# Patient Record
Sex: Female | Born: 1970 | Race: Black or African American | Hispanic: No | Marital: Married | Smoking: Former smoker
Health system: Southern US, Community
[De-identification: ages and names within clinical notes are randomized; demographics above are authoritative.]

## PROBLEM LIST (undated history)

## (undated) DIAGNOSIS — E119 Type 2 diabetes mellitus without complications: Secondary | ICD-10-CM

## (undated) DIAGNOSIS — I10 Essential (primary) hypertension: Secondary | ICD-10-CM

## (undated) HISTORY — PX: CHEST TUBE INSERTION: SHX231

## (undated) HISTORY — PX: FOOT SURGERY: SHX648

---

## 2002-11-17 ENCOUNTER — Emergency Department (HOSPITAL_COMMUNITY): Admission: EM | Admit: 2002-11-17 | Discharge: 2002-11-18 | Payer: Self-pay | Admitting: Emergency Medicine

## 2002-11-18 ENCOUNTER — Encounter: Payer: Self-pay | Admitting: Emergency Medicine

## 2003-04-14 ENCOUNTER — Ambulatory Visit (HOSPITAL_COMMUNITY): Admission: RE | Admit: 2003-04-14 | Discharge: 2003-04-14 | Payer: Self-pay

## 2003-04-17 ENCOUNTER — Emergency Department (HOSPITAL_COMMUNITY): Admission: EM | Admit: 2003-04-17 | Discharge: 2003-04-17 | Payer: Self-pay | Admitting: Emergency Medicine

## 2003-04-24 ENCOUNTER — Ambulatory Visit: Admission: RE | Admit: 2003-04-24 | Discharge: 2003-04-24 | Payer: Self-pay | Admitting: Emergency Medicine

## 2003-11-26 ENCOUNTER — Emergency Department (HOSPITAL_COMMUNITY): Admission: EM | Admit: 2003-11-26 | Discharge: 2003-11-26 | Payer: Self-pay | Admitting: Emergency Medicine

## 2004-02-14 ENCOUNTER — Emergency Department (HOSPITAL_COMMUNITY): Admission: EM | Admit: 2004-02-14 | Discharge: 2004-02-14 | Payer: Self-pay | Admitting: Emergency Medicine

## 2004-04-23 ENCOUNTER — Emergency Department (HOSPITAL_COMMUNITY): Admission: EM | Admit: 2004-04-23 | Discharge: 2004-04-24 | Payer: Self-pay | Admitting: Emergency Medicine

## 2004-04-25 ENCOUNTER — Inpatient Hospital Stay (HOSPITAL_COMMUNITY): Admission: AD | Admit: 2004-04-25 | Discharge: 2004-04-25 | Payer: Self-pay | Admitting: Obstetrics and Gynecology

## 2004-04-27 ENCOUNTER — Inpatient Hospital Stay (HOSPITAL_COMMUNITY): Admission: AD | Admit: 2004-04-27 | Discharge: 2004-04-27 | Payer: Self-pay | Admitting: Obstetrics and Gynecology

## 2004-04-29 ENCOUNTER — Inpatient Hospital Stay (HOSPITAL_COMMUNITY): Admission: AD | Admit: 2004-04-29 | Discharge: 2004-04-29 | Payer: Self-pay | Admitting: *Deleted

## 2004-05-06 ENCOUNTER — Inpatient Hospital Stay (HOSPITAL_COMMUNITY): Admission: AD | Admit: 2004-05-06 | Discharge: 2004-05-06 | Payer: Self-pay | Admitting: *Deleted

## 2004-05-20 ENCOUNTER — Inpatient Hospital Stay (HOSPITAL_COMMUNITY): Admission: AD | Admit: 2004-05-20 | Discharge: 2004-05-20 | Payer: Self-pay | Admitting: *Deleted

## 2004-05-27 ENCOUNTER — Inpatient Hospital Stay (HOSPITAL_COMMUNITY): Admission: AD | Admit: 2004-05-27 | Discharge: 2004-05-27 | Payer: Self-pay | Admitting: Obstetrics and Gynecology

## 2005-05-03 ENCOUNTER — Other Ambulatory Visit: Admission: RE | Admit: 2005-05-03 | Discharge: 2005-05-03 | Payer: Self-pay | Admitting: Obstetrics and Gynecology

## 2011-03-19 ENCOUNTER — Emergency Department (HOSPITAL_BASED_OUTPATIENT_CLINIC_OR_DEPARTMENT_OTHER)
Admission: EM | Admit: 2011-03-19 | Discharge: 2011-03-19 | Disposition: A | Discharge status: Home / Self Care | Payer: Managed Care, Other (non HMO) | Attending: Emergency Medicine | Admitting: Emergency Medicine

## 2011-03-19 ENCOUNTER — Encounter: Payer: Self-pay | Admitting: Emergency Medicine

## 2011-03-19 ENCOUNTER — Other Ambulatory Visit: Payer: Self-pay

## 2011-03-19 DIAGNOSIS — R42 Dizziness and giddiness: Secondary | ICD-10-CM | POA: Insufficient documentation

## 2011-03-19 DIAGNOSIS — R51 Headache: Secondary | ICD-10-CM | POA: Insufficient documentation

## 2011-03-19 DIAGNOSIS — R079 Chest pain, unspecified: Secondary | ICD-10-CM | POA: Insufficient documentation

## 2011-03-19 LAB — CBC
MCH: 28.2 pg (ref 26.0–34.0)
MCHC: 32.4 g/dL (ref 30.0–36.0)
MCV: 87.2 fL (ref 78.0–100.0)
Platelets: 310 10*3/uL (ref 150–400)
RBC: 4.39 MIL/uL (ref 3.87–5.11)

## 2011-03-19 LAB — URINALYSIS, ROUTINE W REFLEX MICROSCOPIC
Glucose, UA: NEGATIVE mg/dL
Hgb urine dipstick: NEGATIVE
Ketones, ur: NEGATIVE mg/dL
Leukocytes, UA: NEGATIVE
Protein, ur: NEGATIVE mg/dL
Urobilinogen, UA: 0.2 mg/dL (ref 0.0–1.0)

## 2011-03-19 LAB — BASIC METABOLIC PANEL
CO2: 25 mEq/L (ref 19–32)
Calcium: 8.9 mg/dL (ref 8.4–10.5)
Creatinine, Ser: 0.73 mg/dL (ref 0.50–1.10)
GFR calc non Af Amer: >90 mL/min (ref >90)

## 2011-03-19 MED ORDER — KETOROLAC TROMETHAMINE 30 MG/ML IJ SOLN
30.0000 mg | Freq: Once | INTRAMUSCULAR | Status: AC
  Administered 2011-03-19: 30 mg via INTRAVENOUS
  Filled 2011-03-19: qty 1

## 2011-03-19 MED ORDER — SODIUM CHLORIDE 0.9 % IV BOLUS (SEPSIS)
1000.0000 mL | Freq: Once | INTRAVENOUS | Status: AC
  Administered 2011-03-19: 1000 mL via INTRAVENOUS

## 2011-03-19 NOTE — ED Notes (Signed)
Pt c/o LT chest pain with radiation to LT jaw and "upper teeth"; also reports some dizziness and lightheadedness.

## 2011-03-19 NOTE — ED Provider Notes (Signed)
History     CSN: [id number] Arrival date & time: 03/19/2011 10:47 AM   First MD Initiated Contact with Patient 03/19/11 1108      Chief Complaint  Patient presents with  . Chest Pain    (Consider location/radiation/quality/duration/timing/severity/associated sxs/prior treatment) HPI Comments: Patient presents with multiple different symptoms.  She states that yesterday she had some onset of pinching in the left side of her chest.  She also had some dizziness symptoms which are not vertiginous but occasionally are a feeling of lightheadedness where she may pass out but she has not done so.  She has been in her normal state of health without any fevers, shortness of breath, coughing or cold symptoms, no nausea, no vomiting, no diarrhea, no dysuria.  Her last menstrual period was November 12 and normal for her.  Patient also states that she has pain in her upper jaw on both sides that she feels that there is pain in her teeth.  She denies any sinus pressure or congestion.  She also endorses some mild headache that she's had over the last week which she took Fioricet for it and it helped initially but now her headache has returned.  It is a dull achy headache.  Patient is a 40 y.o. female presenting with chest pain. The history is provided by the patient. No language interpreter was used.  Chest Pain The chest pain began yesterday. Chest pain occurs intermittently. The chest pain is unchanged. Primary symptoms include dizziness. Pertinent negatives for primary symptoms include no fever, no shortness of breath, no cough, no abdominal pain, no nausea and no vomiting.  Dizziness does not occur with nausea or vomiting.     Past Medical History  Diagnosis Date  . Pneumothorax     Past Surgical History  Procedure Date  . Foot surgery   . Chest tube insertion     No family history on file.  History  Substance Use Topics  . Smoking status: Never Smoker   . Smokeless tobacco: Not on file    . Alcohol Use: Yes     social    OB History    Grav Para Term Preterm Abortions TAB SAB Ect Mult Living                  Review of Systems  Constitutional: Negative.  Negative for fever and chills.  Eyes: Negative.  Negative for discharge and redness.  Respiratory: Negative.  Negative for cough and shortness of breath.   Cardiovascular: Positive for chest pain.  Gastrointestinal: Negative.  Negative for nausea, vomiting, abdominal pain and diarrhea.  Genitourinary: Negative.  Negative for dysuria and vaginal discharge.  Musculoskeletal: Negative.  Negative for back pain.  Skin: Negative.  Negative for color change and rash.  Neurological: Positive for dizziness and light-headedness. Negative for syncope and headaches.  Hematological: Negative.  Negative for adenopathy.  Psychiatric/Behavioral: Negative.  Negative for confusion.  All other systems reviewed and are negative.    Allergies  Review of patient's allergies indicates no known allergies.  Home Medications  No current outpatient prescriptions on file.  BP 115/82  Pulse 82  Resp 18  SpO2 100%  LMP 02/20/2011  Physical Exam  Constitutional: She is oriented to person, place, and time. She appears well-developed and well-nourished.  Non-toxic appearance. She does not have a sickly appearance.  HENT:  Head: Normocephalic and atraumatic.  Eyes: Conjunctivae, EOM and lids are normal. Pupils are equal, round, and reactive to light. No scleral icterus.  Neck: Trachea normal and normal range of motion. Neck supple.  Cardiovascular: Normal rate, regular rhythm and normal heart sounds.   Pulmonary/Chest: Effort normal and breath sounds normal. No respiratory distress. She has no wheezes. She has no rales.  Abdominal: Soft. Normal appearance. There is no tenderness. There is no rebound, no guarding and no CVA tenderness.  Musculoskeletal: Normal range of motion.  Neurological: She is alert and oriented to person, place,  and time. She has normal strength.  Skin: Skin is warm, dry and intact. No rash noted.  Psychiatric: She has a normal mood and affect. Her behavior is normal. Judgment and thought content normal.    ED Course  Procedures (including critical care time)  Results for orders placed during the hospital encounter of 03/19/11  CBC      Component Value Range   WBC 4.5  4.0 - 10.5 (K/uL)   RBC 4.39  3.87 - 5.11 (MIL/uL)   Hemoglobin 12.4  12.0 - 15.0 (g/dL)   HCT 16.1  09.6 - 04.5 (%)   MCV 87.2  78.0 - 100.0 (fL)   MCH 28.2  26.0 - 34.0 (pg)   MCHC 32.4  30.0 - 36.0 (g/dL)   RDW 40.9  81.1 - 91.4 (%)   Platelets 310  150 - 400 (K/uL)  BASIC METABOLIC PANEL      Component Value Range   Sodium 136  135 - 145 (mEq/L)   Potassium 4.0  3.5 - 5.1 (mEq/L)   Chloride 103  96 - 112 (mEq/L)   CO2 25  19 - 32 (mEq/L)   Glucose, Bld 105 (*) 70 - 99 (mg/dL)   BUN 11  6 - 23 (mg/dL)   Creatinine, Ser PENDING  0.50 - 1.10 (mg/dL)   Calcium 8.9  8.4 - 78.2 (mg/dL)   GFR calc non Af Amer PENDING  >90 (mL/min)   GFR calc Af Amer PENDING  >90 (mL/min)  URINALYSIS, ROUTINE W REFLEX MICROSCOPIC      Component Value Range   Color, Urine YELLOW  YELLOW    APPearance CLEAR  CLEAR    Specific Gravity, Urine 1.027  1.005 - 1.030    pH 5.0  5.0 - 8.0    Glucose, UA NEGATIVE  NEGATIVE (mg/dL)   Hgb urine dipstick NEGATIVE  NEGATIVE    Bilirubin Urine NEGATIVE  NEGATIVE    Ketones, ur NEGATIVE  NEGATIVE (mg/dL)   Protein, ur NEGATIVE  NEGATIVE (mg/dL)   Urobilinogen, UA 0.2  0.0 - 1.0 (mg/dL)   Nitrite NEGATIVE  NEGATIVE    Leukocytes, UA NEGATIVE  NEGATIVE   PREGNANCY, URINE      Component Value Range   Preg Test, Ur NEGATIVE    TROPONIN I      Component Value Range   Troponin I <0.30  <0.30 (ng/mL)      Date: 03/19/2011  Rate: 64  Rhythm: normal sinus rhythm  QRS Axis: normal  Intervals: normal  ST/T Wave abnormalities: normal  Conduction Disutrbances:none  Narrative Interpretation:   Old  EKG Reviewed: unchanged from 04/17/2003    MDM  Patient with chest pain that is atypical for ACS.  Patient is also low risk by TIMI SCORE and given that her pain started yesterday and her cardiac markers are normal today with a normal EKG I feel she is safe for discharge home with followup with her primary care physician.  She has no acute symptoms for infection.  Her lightheadedness may be due to some mild dehydration  but does not appear to be neurovascular origin.  Her vital signs are normal here.  Her laboratory studies are normal and she is not pregnant.        Nat Christen, MD 03/19/11 [phone]

## 2012-09-21 ENCOUNTER — Encounter (HOSPITAL_BASED_OUTPATIENT_CLINIC_OR_DEPARTMENT_OTHER): Payer: Self-pay

## 2012-09-21 ENCOUNTER — Emergency Department (HOSPITAL_BASED_OUTPATIENT_CLINIC_OR_DEPARTMENT_OTHER): Payer: Managed Care, Other (non HMO)

## 2012-09-21 ENCOUNTER — Emergency Department (HOSPITAL_BASED_OUTPATIENT_CLINIC_OR_DEPARTMENT_OTHER)
Admission: EM | Admit: 2012-09-21 | Discharge: 2012-09-21 | Disposition: A | Discharge status: Home / Self Care | Payer: Managed Care, Other (non HMO) | Attending: Emergency Medicine | Admitting: Emergency Medicine

## 2012-09-21 DIAGNOSIS — R05 Cough: Secondary | ICD-10-CM | POA: Insufficient documentation

## 2012-09-21 DIAGNOSIS — E119 Type 2 diabetes mellitus without complications: Secondary | ICD-10-CM | POA: Insufficient documentation

## 2012-09-21 DIAGNOSIS — Z8709 Personal history of other diseases of the respiratory system: Secondary | ICD-10-CM | POA: Insufficient documentation

## 2012-09-21 DIAGNOSIS — I10 Essential (primary) hypertension: Secondary | ICD-10-CM | POA: Insufficient documentation

## 2012-09-21 DIAGNOSIS — J209 Acute bronchitis, unspecified: Secondary | ICD-10-CM

## 2012-09-21 DIAGNOSIS — R0789 Other chest pain: Secondary | ICD-10-CM | POA: Insufficient documentation

## 2012-09-21 DIAGNOSIS — Z79899 Other long term (current) drug therapy: Secondary | ICD-10-CM | POA: Insufficient documentation

## 2012-09-21 DIAGNOSIS — Z3202 Encounter for pregnancy test, result negative: Secondary | ICD-10-CM | POA: Insufficient documentation

## 2012-09-21 DIAGNOSIS — R059 Cough, unspecified: Secondary | ICD-10-CM | POA: Insufficient documentation

## 2012-09-21 HISTORY — DX: Type 2 diabetes mellitus without complications: E11.9

## 2012-09-21 HISTORY — DX: Essential (primary) hypertension: I10

## 2012-09-21 LAB — BASIC METABOLIC PANEL
BUN: 11 mg/dL (ref 6–23)
Chloride: 104 mEq/L (ref 96–112)
Creatinine, Ser: 0.9 mg/dL (ref 0.50–1.10)
Glucose, Bld: 106 mg/dL — ABNORMAL HIGH (ref 70–99)
Potassium: 4.5 mEq/L (ref 3.5–5.1)

## 2012-09-21 LAB — CBC WITH DIFFERENTIAL/PLATELET
Eosinophils Absolute: 0.1 10*3/uL (ref 0.0–0.7)
Eosinophils Relative: 2 % (ref 0–5)
Hemoglobin: 12.5 g/dL (ref 12.0–15.0)
Lymphs Abs: 1.9 10*3/uL (ref 0.7–4.0)
MCH: 29.3 pg (ref 26.0–34.0)
MCV: 88.5 fL (ref 78.0–100.0)
Monocytes Absolute: 0.5 10*3/uL (ref 0.1–1.0)
Monocytes Relative: 10 % (ref 3–12)
RBC: 4.27 MIL/uL (ref 3.87–5.11)

## 2012-09-21 LAB — PREGNANCY, URINE: Preg Test, Ur: NEGATIVE

## 2012-09-21 MED ORDER — ASPIRIN 81 MG PO CHEW
CHEWABLE_TABLET | ORAL | Status: AC
  Administered 2012-09-21: 324 mg via ORAL
  Filled 2012-09-21: qty 4

## 2012-09-21 MED ORDER — AZITHROMYCIN 250 MG PO TABS: 250.0000 mg | ORAL_TABLET | Freq: Every day | ORAL | Status: AC

## 2012-09-21 MED ORDER — HYDROCOD POLST-CHLORPHEN POLST 10-8 MG/5ML PO LQCR: 5.0000 mL | Freq: Two times a day (BID) | ORAL | Status: AC | PRN

## 2012-09-21 MED ORDER — ASPIRIN 325 MG PO TABS: 325.0000 mg | ORAL_TABLET | ORAL | Status: DC

## 2012-09-21 MED ORDER — ASPIRIN 81 MG PO CHEW: 324.0000 mg | CHEWABLE_TABLET | Freq: Once | ORAL | Status: AC

## 2012-09-21 NOTE — ED Notes (Signed)
Pt states that she has had cough, sob x3 days, onset of chest pain in mid chest last night.  Pt states that she has been dx with diabetes recently, not on meds for this, dx with htn and on meds.  LS clear but diminished in the RLL.

## 2012-09-21 NOTE — ED Provider Notes (Signed)
History     CSN: [id number]  Arrival date & time 09/21/12  1006   First MD Initiated Contact with Patient 09/21/12 1019      Chief Complaint  Patient presents with  . Shortness of Breath  . Cough    (Consider location/radiation/quality/duration/timing/severity/associated sxs/prior treatment) HPI Comments: Patient presents with complaints of cough, discomfort in the upper part of her chest with coughing, and feeling short of breath for the past three days.  She denies fever or chills.  There is no nausea, diaphoresis, or radiation to the arm or jaw. The cough has been non-productive.  Patient is a 42 y.o. female presenting with shortness of breath. The history is provided by the patient.  Shortness of Breath Severity:  Moderate Onset quality:  Gradual Duration:  3 days Timing:  Constant Progression:  Worsening Chronicity:  New Context: activity   Relieved by:  Nothing Worsened by:  Nothing tried Ineffective treatments:  None tried Associated symptoms: cough     Past Medical History  Diagnosis Date  . Pneumothorax   . Hypertension   . Diabetes mellitus without complication     Past Surgical History  Procedure Laterality Date  . Foot surgery    . Chest tube insertion      History reviewed. No pertinent family history.  History  Substance Use Topics  . Smoking status: Never Smoker   . Smokeless tobacco: Never Used  . Alcohol Use: Yes     Comment: social    OB History   Grav Para Term Preterm Abortions TAB SAB Ect Mult Living                  Review of Systems  Respiratory: Positive for cough and shortness of breath.   All other systems reviewed and are negative.    Allergies  Review of patient's allergies indicates no known allergies.  Home Medications   Current Outpatient Rx  Name  Route  Sig  Dispense  Refill  . amLODipine-valsartan (EXFORGE) 5-160 MG per tablet   Oral   Take 1 tablet by mouth daily.         . Vitamin D, Ergocalciferol,  (DRISDOL) 50000 UNITS CAPS   Oral   Take 50,000 Units by mouth every 7 (seven) days.           BP 113/86  Pulse 94  Temp(Src) 98.9 F (37.2 C) (Oral)  Resp 18  Ht 5\' 7"  (1.702 m)  Wt 274 lb (124.286 kg)  BMI 42.9 kg/m2  SpO2 98%  Physical Exam  Nursing note and vitals reviewed. Constitutional: She is oriented to person, place, and time. She appears well-developed and well-nourished. No distress.  HENT:  Head: Normocephalic and atraumatic.  Neck: Normal range of motion. Neck supple.  Cardiovascular: Normal rate and regular rhythm.  Exam reveals no gallop and no friction rub.   No murmur heard. Pulmonary/Chest: Effort normal and breath sounds normal. No respiratory distress. She has no wheezes.  There is ttp in the upper sternum that seems to reproduce her symptoms.   Abdominal: Soft. Bowel sounds are normal. She exhibits no distension. There is no tenderness.  Musculoskeletal: Normal range of motion.  Neurological: She is alert and oriented to person, place, and time.  Skin: Skin is warm and dry. She is not diaphoretic.    ED Course  Procedures (including critical care time)  Labs Reviewed  BASIC METABOLIC PANEL  TROPONIN I  CBC WITH DIFFERENTIAL   No results found.  No diagnosis found.   Date: 09/21/2012  Rate: 74  Rhythm: normal sinus rhythm  QRS Axis: normal  Intervals: normal  ST/T Wave abnormalities: normal  Conduction Disutrbances:none  Narrative Interpretation:   Old EKG Reviewed: unchanged    MDM  The presentation, exam, and workup are not consistent with a cardiac etiology.  I doubt PE as there is no hypoxia, tachypnea, or tachycardia.  The chest xrays shows only bronchitic changes, but no infiltrate, wbc, or signs or symptoms of respiratory distress.  I feel as though she is stable for discharge, to return prn.  Will treat with zmax, cough medicine.          Geoffery Lyons, MD 09/21/12 1134

## 2014-04-21 ENCOUNTER — Encounter (HOSPITAL_COMMUNITY): Payer: Self-pay | Admitting: Emergency Medicine

## 2014-04-21 ENCOUNTER — Emergency Department (HOSPITAL_COMMUNITY)
Admission: EM | Admit: 2014-04-21 | Discharge: 2014-04-21 | Disposition: A | Discharge status: Home / Self Care | Payer: Managed Care, Other (non HMO) | Attending: Emergency Medicine | Admitting: Emergency Medicine

## 2014-04-21 ENCOUNTER — Emergency Department (HOSPITAL_COMMUNITY): Payer: Managed Care, Other (non HMO)

## 2014-04-21 DIAGNOSIS — Z79899 Other long term (current) drug therapy: Secondary | ICD-10-CM | POA: Diagnosis not present

## 2014-04-21 DIAGNOSIS — R110 Nausea: Secondary | ICD-10-CM | POA: Diagnosis not present

## 2014-04-21 DIAGNOSIS — R61 Generalized hyperhidrosis: Secondary | ICD-10-CM | POA: Insufficient documentation

## 2014-04-21 DIAGNOSIS — R079 Chest pain, unspecified: Secondary | ICD-10-CM

## 2014-04-21 DIAGNOSIS — Z8709 Personal history of other diseases of the respiratory system: Secondary | ICD-10-CM | POA: Insufficient documentation

## 2014-04-21 DIAGNOSIS — I10 Essential (primary) hypertension: Secondary | ICD-10-CM | POA: Diagnosis not present

## 2014-04-21 DIAGNOSIS — E119 Type 2 diabetes mellitus without complications: Secondary | ICD-10-CM | POA: Insufficient documentation

## 2014-04-21 DIAGNOSIS — R0789 Other chest pain: Secondary | ICD-10-CM | POA: Insufficient documentation

## 2014-04-21 LAB — CBC WITH DIFFERENTIAL/PLATELET
BASOS PCT: 0 % (ref 0–1)
Basophils Absolute: 0 10*3/uL (ref 0.0–0.1)
EOS ABS: 0.1 10*3/uL (ref 0.0–0.7)
Eosinophils Relative: 2 % (ref 0–5)
HCT: 39.8 % (ref 36.0–46.0)
Hemoglobin: 12.8 g/dL (ref 12.0–15.0)
Lymphocytes Relative: 42 % (ref 12–46)
Lymphs Abs: 2.1 10*3/uL (ref 0.7–4.0)
MCH: 29 pg (ref 26.0–34.0)
MCHC: 32.2 g/dL (ref 30.0–36.0)
MCV: 90 fL (ref 78.0–100.0)
Monocytes Absolute: 0.4 10*3/uL (ref 0.1–1.0)
Monocytes Relative: 9 % (ref 3–12)
NEUTROS PCT: 47 % (ref 43–77)
Neutro Abs: 2.4 10*3/uL (ref 1.7–7.7)
PLATELETS: 302 10*3/uL (ref 150–400)
RBC: 4.42 MIL/uL (ref 3.87–5.11)
RDW: 13.1 % (ref 11.5–15.5)
WBC: 5.1 10*3/uL (ref 4.0–10.5)

## 2014-04-21 LAB — BASIC METABOLIC PANEL
Anion gap: 5 (ref 5–15)
BUN: 11 mg/dL (ref 6–23)
CHLORIDE: 102 meq/L (ref 96–112)
CO2: 27 mmol/L (ref 19–32)
Calcium: 9.2 mg/dL (ref 8.4–10.5)
Creatinine, Ser: 0.81 mg/dL (ref 0.50–1.10)
GFR, EST NON AFRICAN AMERICAN: 87 mL/min — AB (ref >90)
Glucose, Bld: 118 mg/dL — ABNORMAL HIGH (ref 70–99)
POTASSIUM: 4.1 mmol/L (ref 3.5–5.1)
SODIUM: 134 mmol/L — AB (ref 135–145)

## 2014-04-21 LAB — I-STAT TROPONIN, ED
TROPONIN I, POC: 0 ng/mL (ref 0.00–0.08)
TROPONIN I, POC: 0 ng/mL (ref 0.00–0.08)

## 2014-04-21 MED ORDER — MORPHINE SULFATE 4 MG/ML IJ SOLN
4.0000 mg | Freq: Once | INTRAMUSCULAR | Status: DC
Start: 2014-04-21 — End: 2014-04-22
  Filled 2014-04-21: qty 1

## 2014-04-21 MED ORDER — ONDANSETRON HCL 4 MG/2ML IJ SOLN
4.0000 mg | Freq: Once | INTRAMUSCULAR | Status: AC
  Administered 2014-04-21: 4 mg via INTRAVENOUS
  Filled 2014-04-21: qty 2

## 2014-04-21 MED ORDER — ASPIRIN 81 MG PO CHEW
324.0000 mg | CHEWABLE_TABLET | Freq: Once | ORAL | Status: AC
  Administered 2014-04-21: 324 mg via ORAL
  Filled 2014-04-21: qty 4

## 2014-04-21 NOTE — Discharge Instructions (Signed)

## 2014-04-21 NOTE — ED Notes (Addendum)
Pt began having mid sternal chest heaviness with radiation under L breast @ 1 hour ago, sharp intermittent pain. Pt states she became clammy, shob, dizzy and nauseated.  Pt states she began having numbness to L ring, little finger and thumb just PTA

## 2014-04-21 NOTE — ED Provider Notes (Signed)
CSN: [id number]     Arrival date & time 04/21/14  1947 History   First MD Initiated Contact with Patient 04/21/14 2010     Chief Complaint  Patient presents with  . Chest Pain     (Consider location/radiation/quality/duration/timing/severity/associated sxs/prior Treatment) Patient is a 44 y.o. female presenting with chest pain. The history is provided by the patient.  Chest Pain Pain location:  L chest and substernal area Pain quality: pressure   Pain radiates to:  L arm Pain radiates to the back: no   Pain severity:  No pain Onset quality:  Sudden Duration:  3 hours Timing:  Constant Progression:  Partially resolved Chronicity:  New Context: at rest   Relieved by:  None tried Worsened by:  Nothing tried Ineffective treatments:  None tried Associated symptoms: anxiety, diaphoresis and nausea   Risk factors: birth control, diabetes mellitus, high cholesterol, hypertension and obesity      Patient developed chest pain while driving that started 1 hour prior to arrival. She has never experienced CP before and does not have a cardiologist. She denies SOB, exertional angina. pts symptoms have mostly resolved on arrival.  Past Medical History  Diagnosis Date  . Pneumothorax   . Hypertension   . Diabetes mellitus without complication    Past Surgical History  Procedure Laterality Date  . Foot surgery    . Chest tube insertion     No family history on file. History  Substance Use Topics  . Smoking status: Never Smoker   . Smokeless tobacco: Never Used  . Alcohol Use: Yes     Comment: social   OB History    No data available     Review of Systems  Constitutional: Positive for diaphoresis.  Cardiovascular: Positive for chest pain.  Gastrointestinal: Positive for nausea.    Allergies  Review of patient's allergies indicates no known allergies.  Home Medications   Prior to Admission medications   Medication Sig Start Date End Date Taking? Authorizing Provider   metFORMIN (GLUCOPHAGE) 500 MG tablet Take 500 mg by mouth daily.   Yes Historical Provider, MD  Norethindrone-Ethinyl Estradiol-Fe Biphas (LO LOESTRIN FE) 1 MG-10 MCG / 10 MCG tablet Take 1 tablet by mouth daily.   Yes Historical Provider, MD  omeprazole (PRILOSEC) 40 MG capsule Take 40 mg by mouth daily.   Yes Historical Provider, MD  azithromycin (ZITHROMAX) 250 MG tablet Take 1 tablet (250 mg total) by mouth daily. Take first 2 tablets together, then 1 every day until finished. Patient not taking: Reported on 04/21/2014 09/21/12   Geoffery Lyons, MD  chlorpheniramine-HYDROcodone [facility]) 10-8 MG/5ML LQCR Take 5 mLs by mouth every 12 (twelve) hours as needed. Patient not taking: Reported on 04/21/2014 09/21/12   Geoffery Lyons, MD   BP 124/66 mmHg  Pulse 77  Temp(Src) 98.1 F (36.7 C) (Oral)  Resp 20  Ht  (1.702 m)  Wt 262 lb (118.842 kg)  BMI 41.03 kg/m2  SpO2 98% Physical Exam  Constitutional: She appears well-developed and well-nourished. No distress.  HENT:  Head: Normocephalic and atraumatic.  Eyes: Pupils are equal, round, and reactive to light.  Neck: Normal range of motion. Neck supple.  Cardiovascular: Normal rate and regular rhythm.   Pulmonary/Chest: Effort normal. No respiratory distress. She has no wheezes. She exhibits no tenderness and no bony tenderness.  Abdominal: Soft.  Neurological: She is alert.  Skin: Skin is warm and dry.  Nursing note and vitals reviewed.   ED Course  Procedures (including critical care time) Labs Review Labs Reviewed  BASIC METABOLIC PANEL - Abnormal; Notable for the following:    Sodium 134 (*)    Glucose, Bld 118 (*)    GFR calc non Af Amer 87 (*)    All other components within normal limits  CBC WITH DIFFERENTIAL  I-STAT TROPOININ, ED  Rosezena SensorI-STAT TROPOININ, ED    Imaging Review Dg Chest 2 View  04/21/2014   CLINICAL DATA:  Midsternal chest pain radiating to the left. Symptoms for 2 hr.  EXAM: CHEST  2 VIEW   COMPARISON:  09/21/2012  FINDINGS: The cardiomediastinal contours are normal. The lungs are clear. Pulmonary vasculature is normal. No consolidation, pleural effusion, or pneumothorax. No acute osseous abnormalities are seen.  IMPRESSION: No acute pulmonary process.   Electronically Signed   By: Rubye OaksMelanie  Ehinger M.D.   On: 04/21/2014 22:09     EKG Interpretation   Date/Time:  Tuesday April 21 2014 19:55:41 EST Ventricular Rate:  93 PR Interval:  145 QRS Duration: 77 QT Interval:  350 QTC Calculation: 435 R Axis:   46 Text Interpretation:  Sinus rhythm Left atrial enlargement No significant  change since last tracing Confirmed by HARRISON  MD, FORREST ([id number]) on  04/21/2014 8:44:57 PM      MDM   Final diagnoses:  Chest pain    Patients story is concerning for cardiac pain. She has had two negative Troponins and normal chest xray. EKG is reassuring. She is no longer having pain the ED. The patient is feeling better and is requesting discharge. I discussed the case with Dr. Romeo AppleHarrison, her HEART score is 3, patient can go home if she follow-ups with cardiology and agrees to return to the ED if symptoms return. Pt was offered admission for further evaluation but she prefers home.  44 y.o.Caliana C Kue's evaluation in the Emergency Department is complete. It has been determined that no acute conditions requiring further emergency intervention are present at this time. The patient/guardian have been advised of the diagnosis and plan. We have discussed signs and symptoms that warrant return to the ED, such as changes or worsening in symptoms.  Vital signs are stable at discharge. Filed Vitals:   04/21/14 2308  BP: 124/66  Pulse: 77  Temp:   Resp: 20    Patient/guardian has voiced understanding and agreed to follow-up with the PCP or specialist.     Dorthula Matasiffany G Mykaila Blunck, PA-C 04/21/14 [phone]  Purvis SheffieldForrest Harrison, MD 04/23/14 1025

## 2016-02-12 IMAGING — CR DG CHEST 2V
2 series · 2 of 2 positions shown · non-contrast
Comparison: 09/21/2012

CLINICAL DATA: Midsternal chest pain radiating to the left.
Symptoms for 2 hr.

EXAM:
CHEST  2 VIEW

[w chest pa]
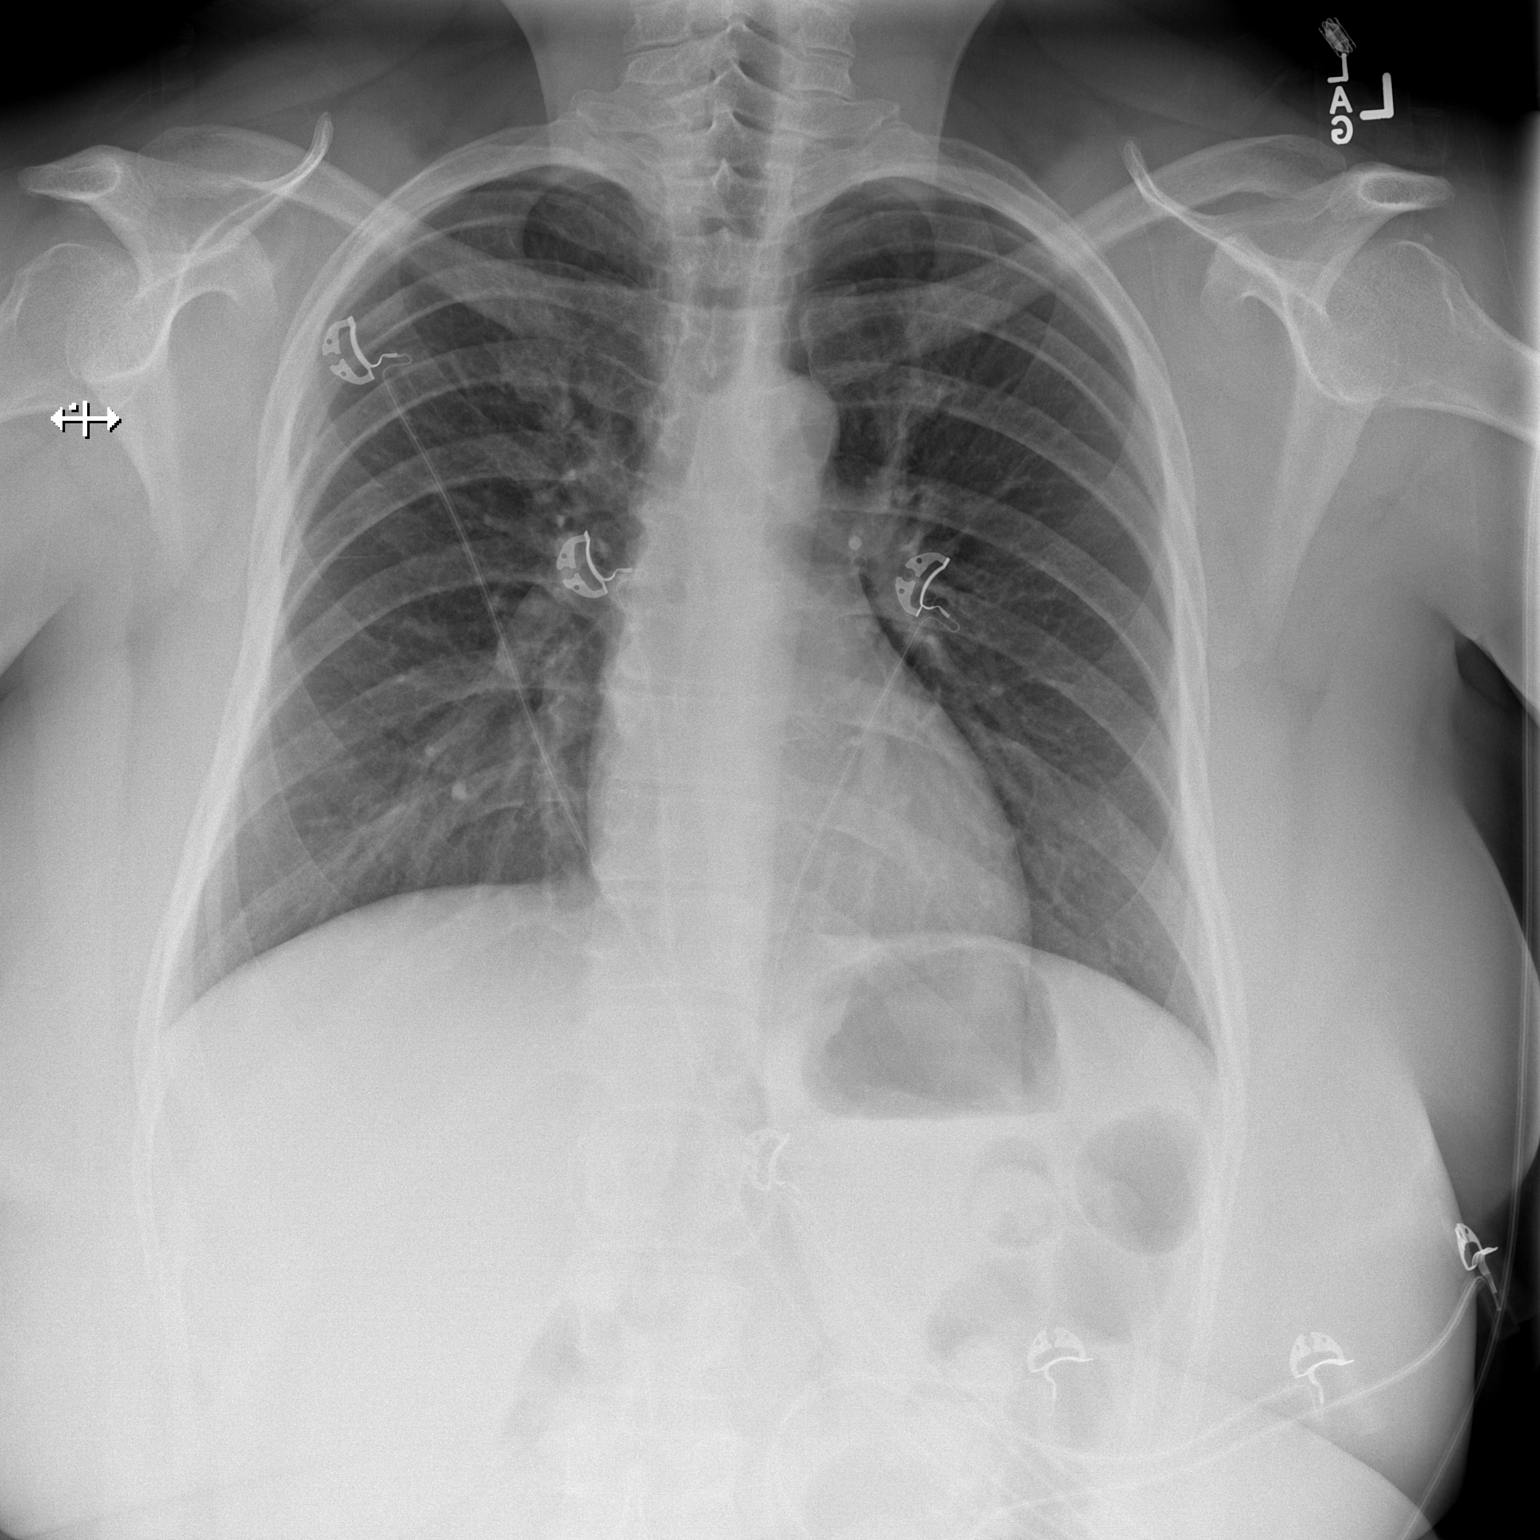

[w chest lat]
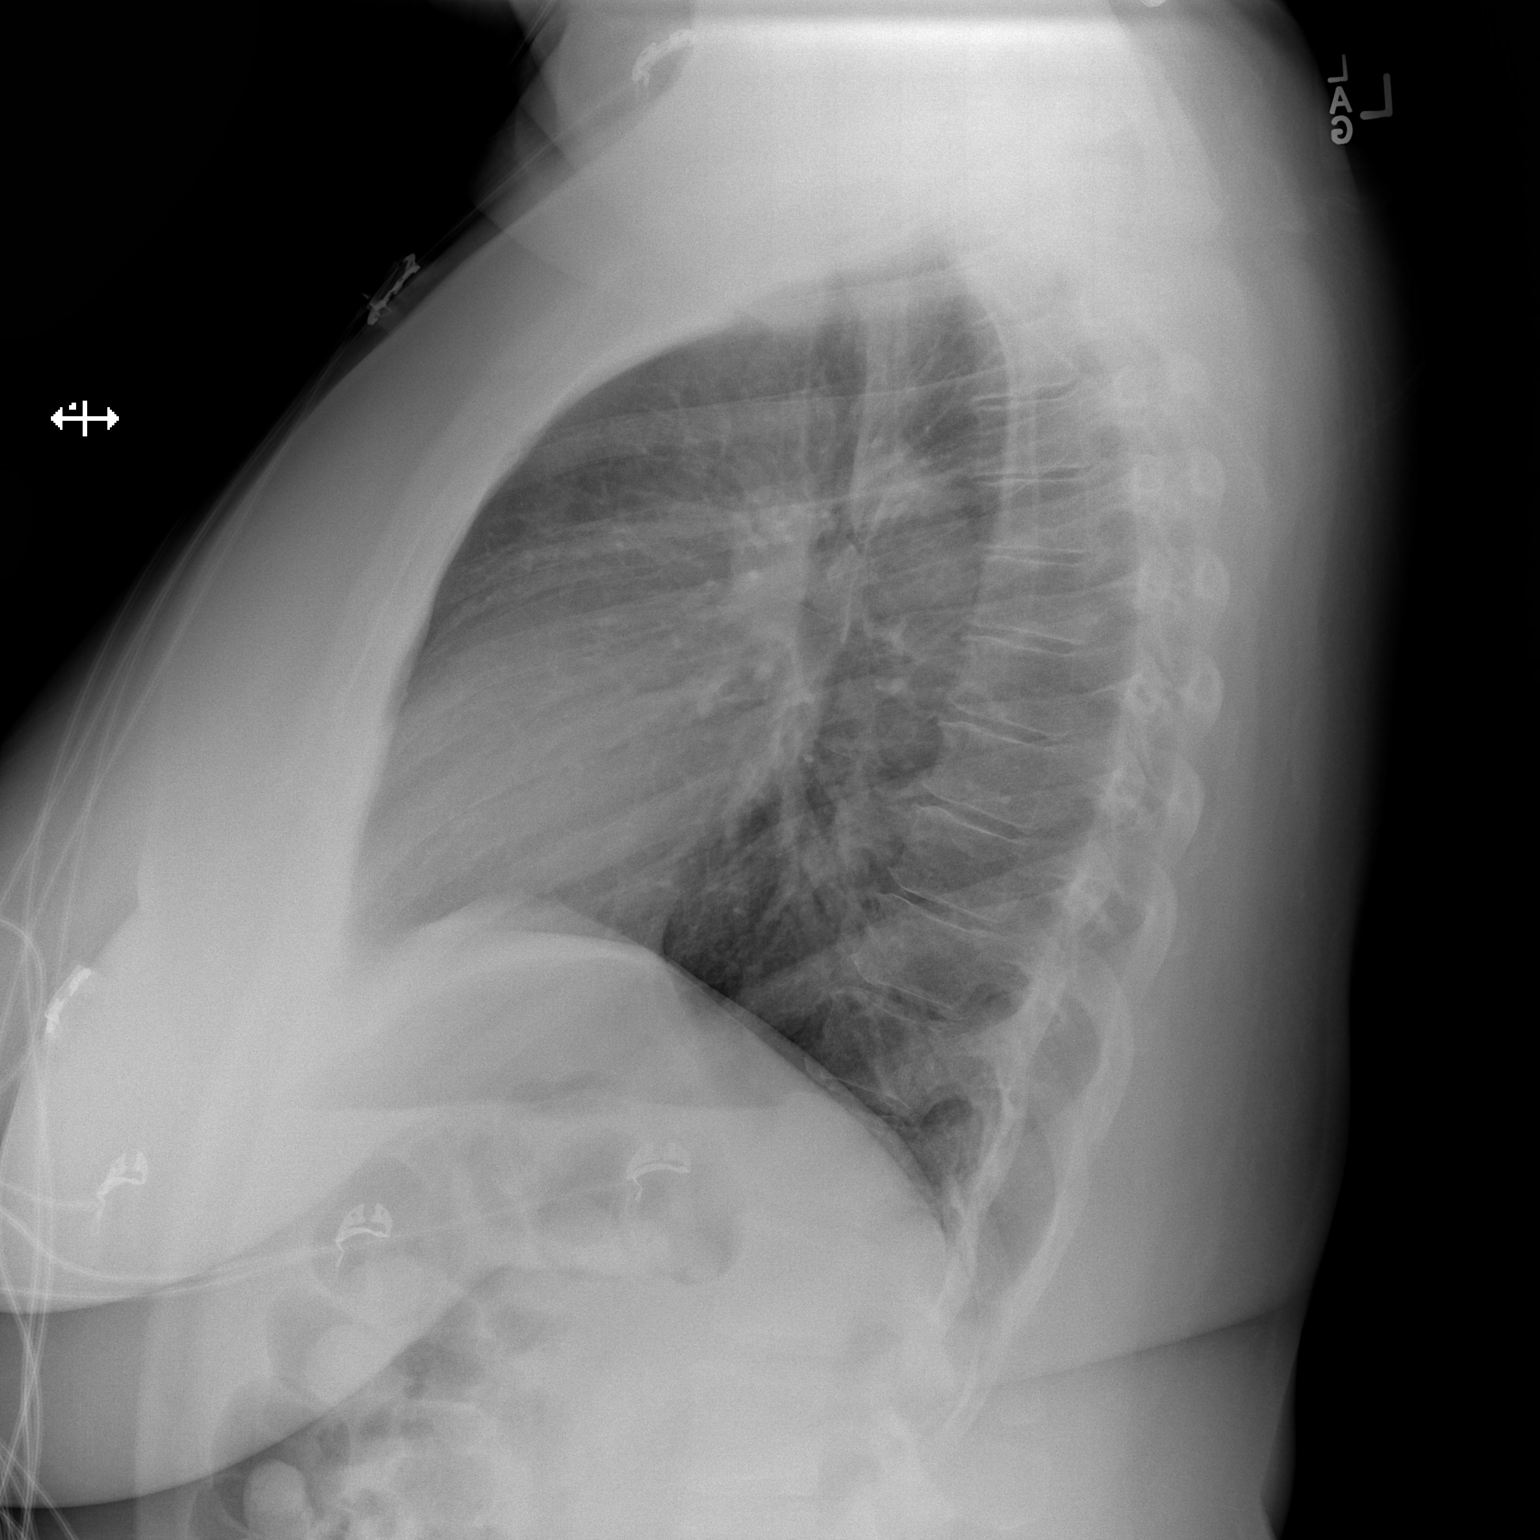

[2 of 2 positions shown; findings below may reference images not displayed]

FINDINGS: The cardiomediastinal contours are normal. The lungs are clear.
Pulmonary vasculature is normal. No consolidation, pleural effusion,
or pneumothorax. No acute osseous abnormalities are seen.
IMPRESSION: No acute pulmonary process.

## 2019-12-15 ENCOUNTER — Emergency Department (HOSPITAL_BASED_OUTPATIENT_CLINIC_OR_DEPARTMENT_OTHER): Payer: PRIVATE HEALTH INSURANCE

## 2019-12-15 ENCOUNTER — Encounter (HOSPITAL_BASED_OUTPATIENT_CLINIC_OR_DEPARTMENT_OTHER): Payer: Self-pay

## 2019-12-15 ENCOUNTER — Emergency Department (HOSPITAL_BASED_OUTPATIENT_CLINIC_OR_DEPARTMENT_OTHER)
Admission: EM | Admit: 2019-12-15 | Discharge: 2019-12-15 | Disposition: A | Discharge status: Home / Self Care | Payer: PRIVATE HEALTH INSURANCE | Attending: Emergency Medicine | Admitting: Emergency Medicine

## 2019-12-15 ENCOUNTER — Other Ambulatory Visit: Payer: Self-pay

## 2019-12-15 DIAGNOSIS — I447 Left bundle-branch block, unspecified: Secondary | ICD-10-CM | POA: Insufficient documentation

## 2019-12-15 DIAGNOSIS — I10 Essential (primary) hypertension: Secondary | ICD-10-CM | POA: Insufficient documentation

## 2019-12-15 DIAGNOSIS — I454 Nonspecific intraventricular block: Secondary | ICD-10-CM

## 2019-12-15 DIAGNOSIS — R079 Chest pain, unspecified: Secondary | ICD-10-CM | POA: Insufficient documentation

## 2019-12-15 DIAGNOSIS — Z7984 Long term (current) use of oral hypoglycemic drugs: Secondary | ICD-10-CM | POA: Insufficient documentation

## 2019-12-15 DIAGNOSIS — E119 Type 2 diabetes mellitus without complications: Secondary | ICD-10-CM | POA: Diagnosis not present

## 2019-12-15 DIAGNOSIS — Z87891 Personal history of nicotine dependence: Secondary | ICD-10-CM | POA: Diagnosis not present

## 2019-12-15 DIAGNOSIS — R0602 Shortness of breath: Secondary | ICD-10-CM | POA: Insufficient documentation

## 2019-12-15 LAB — TROPONIN I (HIGH SENSITIVITY)
Troponin I (High Sensitivity): 7 ng/L (ref <18)
Troponin I (High Sensitivity): 8 ng/L (ref <18)

## 2019-12-15 LAB — CBC
HCT: 41.6 % (ref 36.0–46.0)
Hemoglobin: 13.2 g/dL (ref 12.0–15.0)
MCH: 29 pg (ref 26.0–34.0)
MCHC: 31.7 g/dL (ref 30.0–36.0)
MCV: 91.4 fL (ref 80.0–100.0)
Platelets: 295 10*3/uL (ref 150–400)
RBC: 4.55 MIL/uL (ref 3.87–5.11)
RDW: 13.1 % (ref 11.5–15.5)
WBC: 6.3 10*3/uL (ref 4.0–10.5)
nRBC: 0 % (ref 0.0–0.2)

## 2019-12-15 LAB — BASIC METABOLIC PANEL
Anion gap: 10 (ref 5–15)
BUN: 15 mg/dL (ref 6–20)
CO2: 25 mmol/L (ref 22–32)
Calcium: 9.2 mg/dL (ref 8.9–10.3)
Chloride: 101 mmol/L (ref 98–111)
Creatinine, Ser: 0.88 mg/dL (ref 0.44–1.00)
GFR calc Af Amer: >60 mL/min (ref >60)
GFR calc non Af Amer: >60 mL/min (ref >60)
Glucose, Bld: 149 mg/dL — ABNORMAL HIGH (ref 70–99)
Potassium: 4.3 mmol/L (ref 3.5–5.1)
Sodium: 136 mmol/L (ref 135–145)

## 2019-12-15 LAB — PREGNANCY, URINE: Preg Test, Ur: NEGATIVE

## 2019-12-15 LAB — D-DIMER, QUANTITATIVE: D-Dimer, Quant: 0.48 ug/mL-FEU (ref 0.00–0.50)

## 2019-12-15 MED ORDER — ASPIRIN 81 MG PO CHEW
324.0000 mg | CHEWABLE_TABLET | Freq: Once | ORAL | Status: AC
  Administered 2019-12-15: 324 mg via ORAL
  Filled 2019-12-15: qty 4

## 2019-12-15 MED ORDER — NITROGLYCERIN 0.4 MG SL SUBL
0.4000 mg | SUBLINGUAL_TABLET | SUBLINGUAL | Status: DC | PRN
  Administered 2019-12-15: 0.4 mg via SUBLINGUAL
  Filled 2019-12-15: qty 1

## 2019-12-15 NOTE — ED Notes (Signed)
ED Provider at bedside. 

## 2019-12-15 NOTE — Discharge Instructions (Addendum)

## 2019-12-15 NOTE — ED Triage Notes (Signed)
Pt c/o CP x 1.5 hrs-NAD-steady gait

## 2019-12-15 NOTE — ED Provider Notes (Signed)
[facility] EMERGENCY DEPARTMENT Provider Note   CSN: [id number] Arrival date & time: 12/15/19  1428     History Chief Complaint  Patient presents with  . Chest Pain    Natalie Gill is a 49 y.o. female.  The history is provided by the patient. No language interpreter was used.  Chest Pain Pain location:  Substernal area Pain quality: aching and pressure   Pain radiates to:  Mid back Onset quality:  Unable to specify Duration:  3 hours Timing:  Constant Progression:  Improving Chronicity:  Recurrent Context: not breathing, not drug use, not eating, not intercourse, not lifting, not movement, not raising an arm, not at rest, not stress and not trauma   Relieved by:  Nothing Ineffective treatments:  None tried Associated symptoms: shortness of breath   Associated symptoms: no near-syncope, no numbness, no orthopnea, no palpitations, no PND, no syncope, no vomiting and no weakness   Risk factors: coronary artery disease, diabetes mellitus, high cholesterol, hypertension and obesity   Risk factors: no birth control, no immobilization, no prior DVT/PE and no surgery     HPI: A 49 year old patient with a history of treated diabetes, hypertension, hypercholesterolemia and obesity presents for evaluation of chest pain. Initial onset of pain was approximately 1-3 hours ago. The patient's chest pain is described as heaviness/pressure/tightness, is sharp and is not worse with exertion. The patient's chest pain is middle- or left-sided, is not well-localized and does not radiate to the arms/jaw/neck. The patient does not complain of nausea and denies diaphoresis. The patient has a family history of coronary artery disease in a first-degree relative with onset less than age 23. The patient has no history of stroke, has no history of peripheral artery disease and has not smoked in the past 90 days.   Past Medical History:  Diagnosis Date  . Diabetes mellitus without complication  (HCC)   . Hypertension   . Pneumothorax     There are no problems to display for this patient.   Past Surgical History:  Procedure Laterality Date  . CHEST TUBE INSERTION    . FOOT SURGERY       OB History   No obstetric history on file.     No family history on file.  Social History   Tobacco Use  . Smoking status: Former Games developer  . Smokeless tobacco: Never Used  Vaping Use  . Vaping Use: Never used  Substance Use Topics  . Alcohol use: Yes    Comment: occ  . Drug use: No    Home Medications Prior to Admission medications   Medication Sig Start Date End Date Taking? Authorizing Provider  azithromycin (ZITHROMAX) 250 MG tablet Take 1 tablet (250 mg total) by mouth daily. Take first 2 tablets together, then 1 every day until finished. Patient not taking: Reported on 04/21/2014 09/21/12   Geoffery Lyons, MD  chlorpheniramine-HYDROcodone [facility] PENNKINETIC ER) 10-8 MG/5ML LQCR Take 5 mLs by mouth every 12 (twelve) hours as needed. Patient not taking: Reported on 04/21/2014 09/21/12   Geoffery Lyons, MD  metFORMIN (GLUCOPHAGE) 500 MG tablet Take 500 mg by mouth daily.    [provider]  Norethindrone-Ethinyl Estradiol-Fe Biphas (LO LOESTRIN FE) 1 MG-10 MCG / 10 MCG tablet Take 1 tablet by mouth daily.    [provider]  omeprazole (PRILOSEC) 40 MG capsule Take 40 mg by mouth daily.    [provider]    Allergies    Tramadol  Review of Systems  Review of Systems  Constitutional: Negative.   HENT: Negative.   Eyes: Negative.   Respiratory: Positive for shortness of breath.   Cardiovascular: Positive for chest pain. Negative for palpitations, orthopnea, syncope, PND and near-syncope.  Gastrointestinal: Negative for vomiting.  Endocrine: Negative.  Negative for cold intolerance.  Genitourinary: Negative.   Musculoskeletal: Negative.   Skin: Negative.   Neurological: Negative for weakness and numbness.    Physical Exam Updated Vital  Signs BP 121/66   Pulse 82   Temp 98.7 F (37.1 C) (Oral)   Resp (!) 27   SpO2 100%   Physical Exam Vitals and nursing note reviewed.  Constitutional:      General: She is not in acute distress.    Appearance: She is well-developed. She is not diaphoretic.  HENT:     Head: Normocephalic and atraumatic.  Eyes:     General: No scleral icterus.    Conjunctiva/sclera: Conjunctivae normal.  Cardiovascular:     Rate and Rhythm: Normal rate and regular rhythm.     Heart sounds: Normal heart sounds. No murmur heard.  No friction rub. No gallop.   Pulmonary:     Effort: Pulmonary effort is normal. No respiratory distress.     Breath sounds: Normal breath sounds.  Abdominal:     General: Bowel sounds are normal. There is no distension.     Palpations: Abdomen is soft. There is no mass.     Tenderness: There is no abdominal tenderness. There is no guarding.  Musculoskeletal:     Cervical back: Normal range of motion.  Skin:    General: Skin is warm and dry.  Neurological:     Mental Status: She is alert and oriented to person, place, and time.  Psychiatric:        Behavior: Behavior normal.     ED Results / Procedures / Treatments   Labs (all labs ordered are listed, but only abnormal results are displayed) Labs Reviewed  BASIC METABOLIC PANEL - Abnormal; Notable for the following components:      Result Value   Glucose, Bld 149 (*)    All other components within normal limits  CBC  PREGNANCY, URINE  D-DIMER, QUANTITATIVE (NOT AT [facility])  TROPONIN I (HIGH SENSITIVITY)  TROPONIN I (HIGH SENSITIVITY)    EKG EKG Interpretation  Date/Time:  Monday December 15 2019 14:32:49 EDT Ventricular Rate:  110 PR Interval:  150 QRS Duration: 132 QT Interval:  364 QTC Calculation: 492 R Axis:   49 Text Interpretation: Sinus tachycardia T wave abnormality, consider inferolateral ischemia Abnormal ECG LBBB is new Confirmed by Benjiman Core [phone]) on 12/15/2019 5:08:21  PM   Radiology DG Chest 2 View  Result Date: 12/15/2019 CLINICAL DATA:  Chest pain. EXAM: CHEST - 2 VIEW COMPARISON:  December 20, 2017. FINDINGS: The heart size and mediastinal contours are within normal limits. Both lungs are clear. No pneumothorax or pleural effusion is noted. The visualized skeletal structures are unremarkable. IMPRESSION: No active cardiopulmonary disease. Electronically Signed   By: Lupita Raider M.D.   On: 12/15/2019 14:55    Procedures Procedures (including critical care time)  Medications Ordered in ED Medications  aspirin chewable tablet 324 mg (324 mg Oral Given 12/15/19 1719)    ED Course  I have reviewed the triage vital signs and the nursing notes.  Pertinent labs & imaging results that were available during my care of the patient were reviewed by me and considered in my medical decision making (see chart  for details).    MDM Rules/Calculators/A&P HEAR Score: 4                        Given the large differential diagnosis for ISOLDE SKAFF, the decision making in this case is of high complexity. I ordered interpreted and reviewed patient's labs which include CBC without abnormality, BMP shows mildly elevated blood glucose of insignificant value, negative pregnancy test, troponin negative x2 and D-dimer within normal limits.  I personally reviewed the patient's 2 view chest x-ray which shows no acute abnormalities.  EKG is abnormal with a new left bundle branch block.  I do not think that this represents active ACS and will have the patient follow closely with cardiology.  After evaluating all of the data points in this case, the presentation of JAMELLE NOY is NOT consistent with Acute Coronary Syndrome (ACS) and/or myocardial ischemia, pulmonary embolism, aortic dissection; Borhaave's, significant arrythmia, pneumothorax, cardiac tamponade, or other emergent cardiopulmonary condition.  Further, the presentation of TEHILLA COFFEL is NOT consistent  with pericarditis, myocarditis, cholecystitis, pancreatitis, mediastinitis, endocarditis, new valvular disease.  Additionally, the presentation of Tempestt C Mcqueenis NOT consistent with flail chest, cardiac contusion, ARDS, or significant intra-thoracic or intra-abdominal bleeding.  Moreover, this presentation is NOT consistent with pneumonia, sepsis, or pyelonephritis.     Strict return and follow-up precautions have been given by me personally or by detailed written instruction given verbally by nursing staff using the teach back method to the patient/family/caregiver(s).  Data Reviewed/Counseling: I have reviewed the patient's vital signs, nursing notes, and other relevant tests/information. I had a detailed discussion regarding the historical points, exam findings, and any diagnostic results supporting the discharge diagnosis. I also discussed the need for outpatient follow-up and the need to return to the ED if symptoms worsen or if there are any questions or concerns that arise at home.  Final Clinical Impression(s) / ED Diagnoses Final diagnoses:  Chest pain, unspecified type  Bundle branch block    Rx / DC Orders ED Discharge Orders    None       Arthor Captain, PA-C 12/16/19 Irene Shipper, MD 12/16/19 2230
